# Patient Record
Sex: Female | Born: 1970 | Race: White | Hispanic: No | Marital: Married | Smoking: Never smoker
Health system: Southern US, Community
[De-identification: ages and names within clinical notes are randomized; demographics above are authoritative.]

---

## 2011-07-10 ENCOUNTER — Ambulatory Visit: Payer: Self-pay

## 2012-08-26 ENCOUNTER — Ambulatory Visit: Payer: Self-pay

## 2013-08-28 ENCOUNTER — Ambulatory Visit: Payer: Self-pay | Admitting: Obstetrics and Gynecology

## 2013-09-02 ENCOUNTER — Ambulatory Visit: Payer: Self-pay | Admitting: Obstetrics and Gynecology

## 2014-09-01 ENCOUNTER — Ambulatory Visit: Payer: Self-pay | Admitting: Obstetrics and Gynecology

## 2014-09-13 ENCOUNTER — Ambulatory Visit: Payer: Self-pay | Admitting: Obstetrics and Gynecology

## 2015-09-19 ENCOUNTER — Other Ambulatory Visit: Payer: Self-pay | Admitting: Obstetrics and Gynecology

## 2015-09-19 DIAGNOSIS — Z1231 Encounter for screening mammogram for malignant neoplasm of breast: Secondary | ICD-10-CM

## 2015-09-21 ENCOUNTER — Ambulatory Visit
Admission: RE | Admit: 2015-09-21 | Discharge: 2015-09-21 | Disposition: A | Discharge status: Home / Self Care | Payer: Managed Care, Other (non HMO) | Source: Ambulatory Visit | Attending: Obstetrics and Gynecology | Admitting: Obstetrics and Gynecology

## 2015-09-21 ENCOUNTER — Other Ambulatory Visit: Payer: Self-pay | Admitting: Obstetrics and Gynecology

## 2015-09-21 DIAGNOSIS — Z1231 Encounter for screening mammogram for malignant neoplasm of breast: Secondary | ICD-10-CM | POA: Insufficient documentation

## 2015-09-26 ENCOUNTER — Other Ambulatory Visit: Payer: Self-pay | Admitting: Obstetrics and Gynecology

## 2015-09-26 ENCOUNTER — Ambulatory Visit
Admission: RE | Admit: 2015-09-26 | Discharge: 2015-09-26 | Disposition: A | Discharge status: Home / Self Care | Payer: Managed Care, Other (non HMO) | Source: Ambulatory Visit | Attending: Obstetrics and Gynecology | Admitting: Obstetrics and Gynecology

## 2015-09-26 DIAGNOSIS — R928 Other abnormal and inconclusive findings on diagnostic imaging of breast: Secondary | ICD-10-CM

## 2015-09-26 DIAGNOSIS — N6001 Solitary cyst of right breast: Secondary | ICD-10-CM | POA: Insufficient documentation

## 2015-10-06 ENCOUNTER — Other Ambulatory Visit: Payer: Managed Care, Other (non HMO)

## 2015-10-06 ENCOUNTER — Ambulatory Visit: Payer: Managed Care, Other (non HMO)

## 2017-01-23 IMAGING — MG MM DIAG BREAST TOMO UNI RIGHT
6 series · 6 of 14 positions shown · non-contrast
Comparison: Previous exam(s).

CLINICAL DATA: Call back from screening for a possible mass and
possible asymmetry in the right breast.

EXAM:
DIGITAL DIAGNOSTIC RIGHT MAMMOGRAM WITH 3D TOMOSYNTHESIS WITH CAD
ULTRASOUND RIGHT BREAST

[R CC]
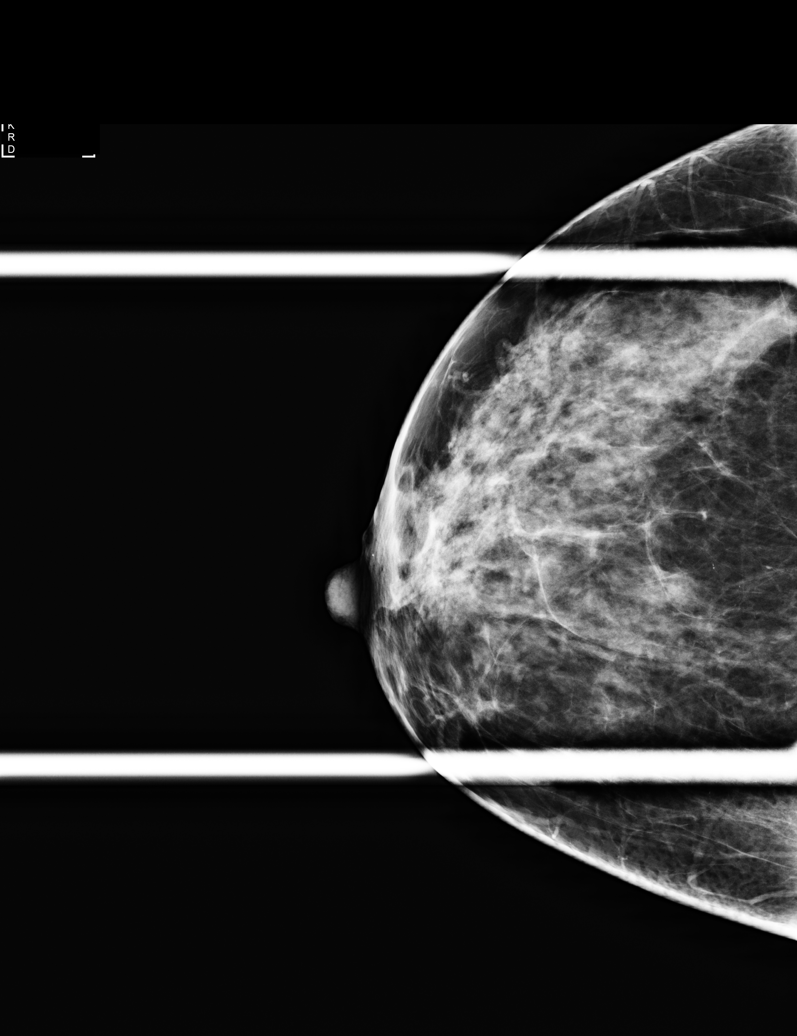

[R MLO synth-2D]
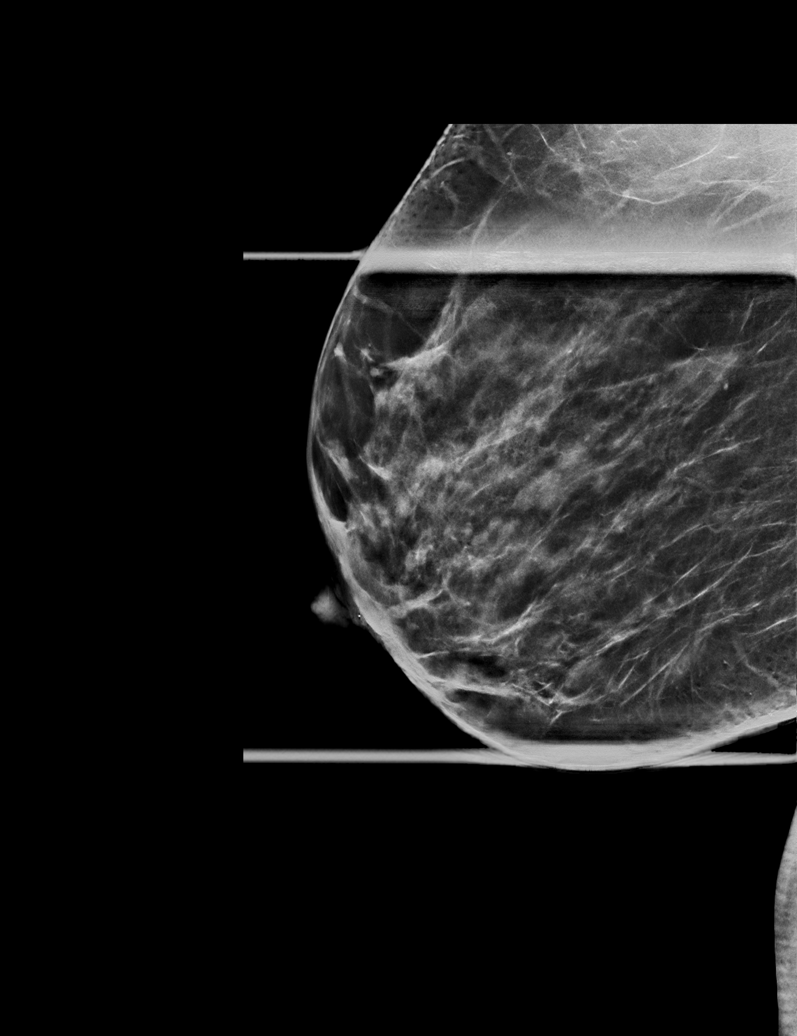

[R MLO]
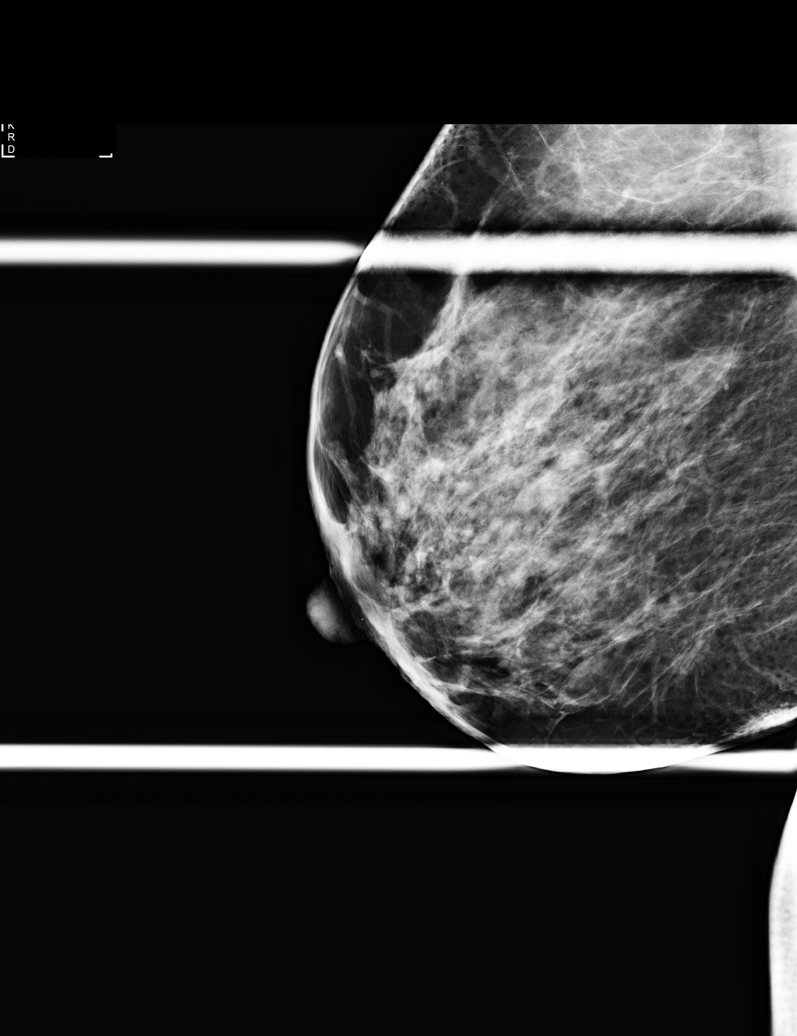

[R CC synth-2D]
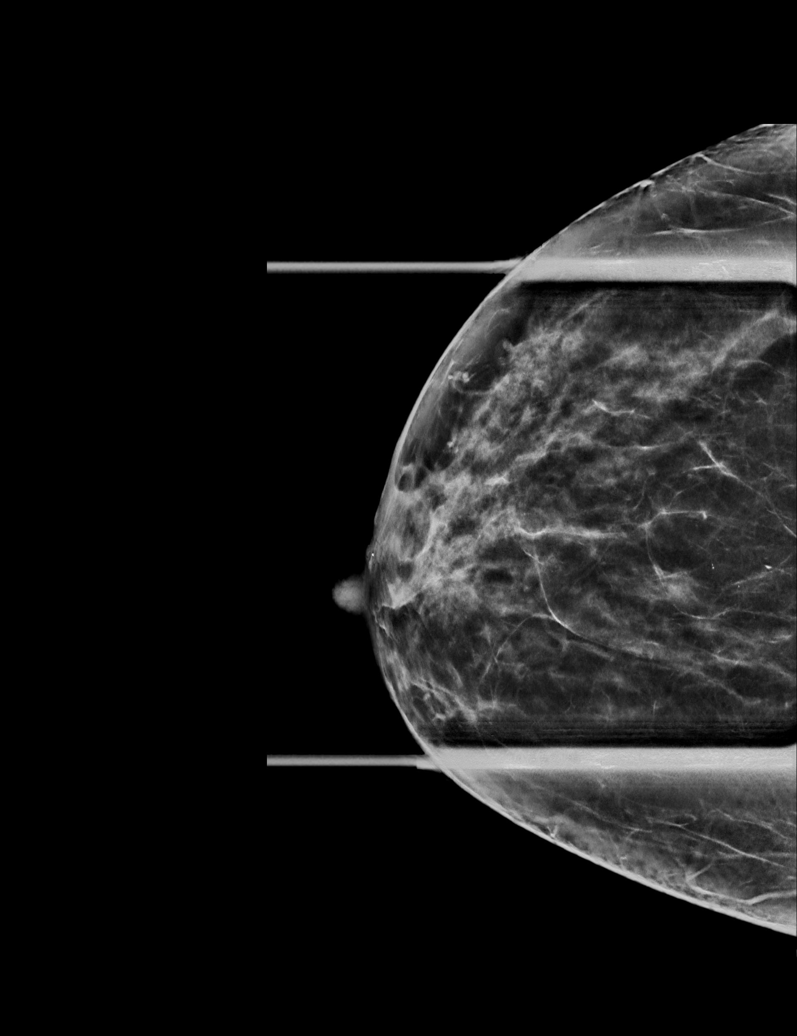

[R CC tomo · tomo slice 32/63.0]
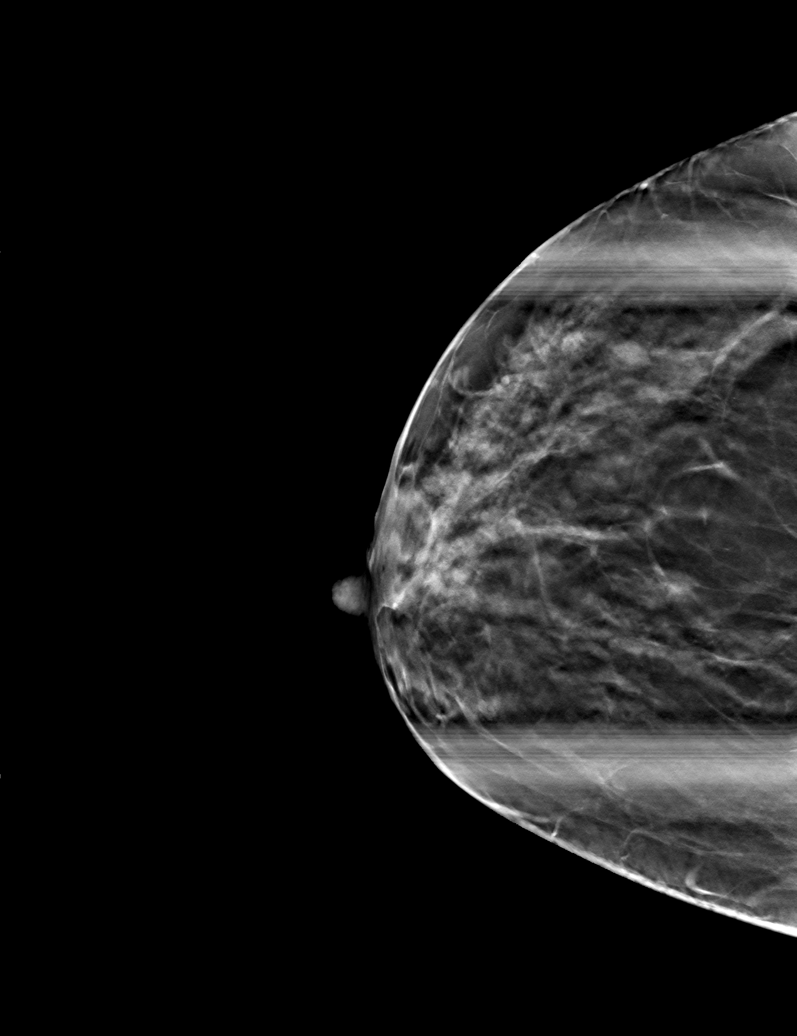

[R MLO tomo · tomo slice 35/68.0]
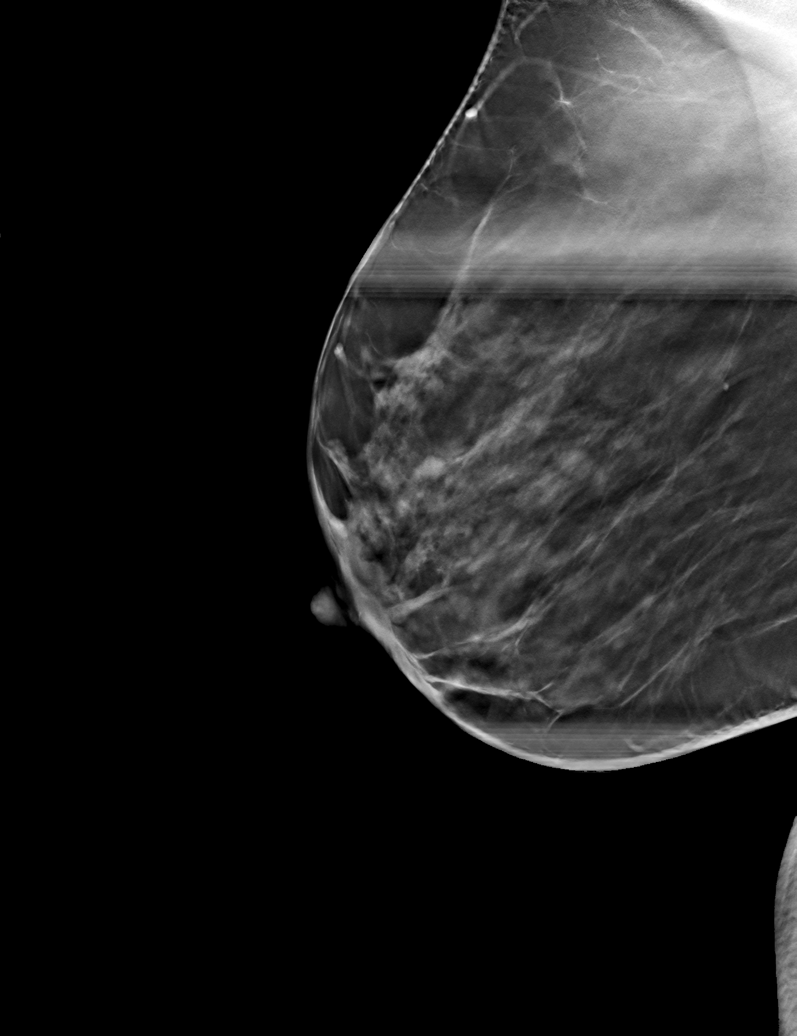

[6 of 14 positions shown; findings below may reference images not displayed]

ACR Breast Density Category c: The breast tissue is heterogeneously
dense, which may obscure small masses.
FINDINGS: On the spot-compression 2D and 3D images, several small masses are
noted extending from the lateral to the superior aspect of the right
breast. One of these represents the possible mass seen on the
current screening study. There are no areas of architectural
distortion and no suspicious calcifications.

Mammographic images were processed with CAD.

On physical exam, no mass is palpated in the lateral or upper outer
right breast.

Targeted ultrasound is performed, showing multiple small cysts
throughout the lateral and upper outer right breast. A group of
cysts is seen in the 11 o'clock position 3 cm from nipple. All cysts
measure 4 mm. There are no solid masses or suspicious lesions.
IMPRESSION: Multiple benign right breast cysts.  No evidence of malignancy.

RECOMMENDATION:
Screening mammogram in one year.(Code:EG-D-SI2)

I have discussed the findings and recommendations with the patient.
Results were also provided in writing at the conclusion of the
visit. If applicable, a reminder letter will be sent to the patient
regarding the next appointment.

BI-RADS CATEGORY  2: Benign.

## 2017-04-09 ENCOUNTER — Other Ambulatory Visit: Payer: Self-pay | Admitting: Obstetrics and Gynecology

## 2017-04-09 DIAGNOSIS — Z1231 Encounter for screening mammogram for malignant neoplasm of breast: Secondary | ICD-10-CM

## 2017-04-17 ENCOUNTER — Ambulatory Visit
Admission: RE | Admit: 2017-04-17 | Discharge: 2017-04-17 | Disposition: A | Discharge status: Home / Self Care | Payer: BLUE CROSS/BLUE SHIELD | Source: Ambulatory Visit | Attending: Obstetrics and Gynecology | Admitting: Obstetrics and Gynecology

## 2017-04-17 DIAGNOSIS — Z1231 Encounter for screening mammogram for malignant neoplasm of breast: Secondary | ICD-10-CM | POA: Diagnosis not present

## 2017-11-04 IMAGING — US US BREAST LTD UNI RIGHT INC AXILLA
1 series · 13 of 20 positions shown · non-contrast
Comparison: Previous exam(s).

CLINICAL DATA: Call back from screening for a possible mass and
possible asymmetry in the right breast.

EXAM:
DIGITAL DIAGNOSTIC RIGHT MAMMOGRAM WITH 3D TOMOSYNTHESIS WITH CAD
ULTRASOUND RIGHT BREAST

[Series 1: us breast ltd uni right inc axilla · 0.06mm/px · 13 of 20 slices shown]
[im 1/20]
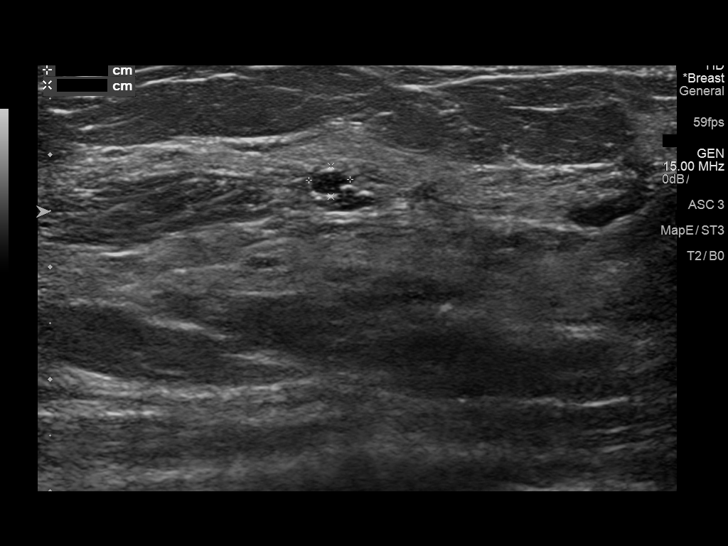
[im 3/20]
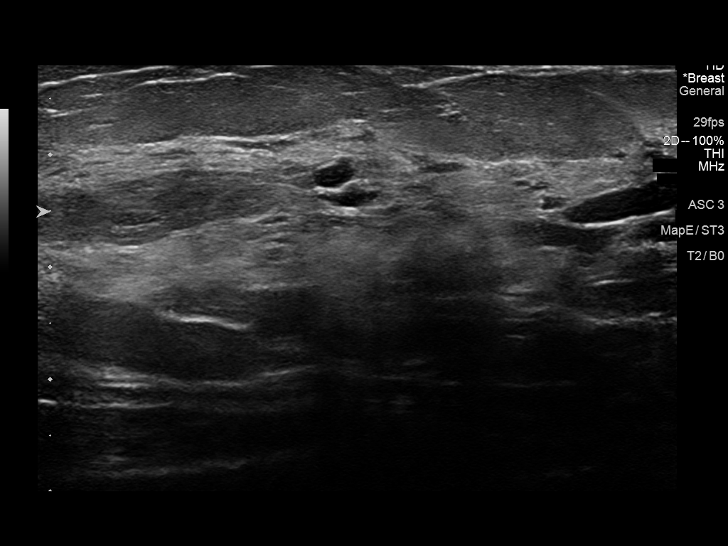
[im 4/20]
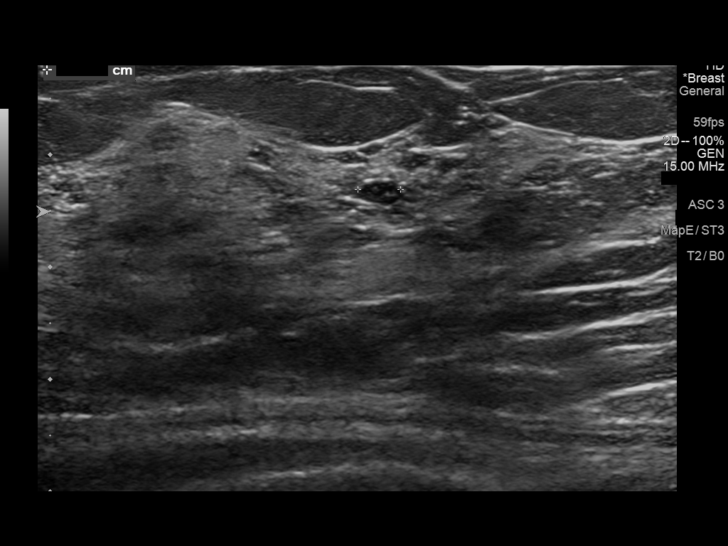
[im 6/20]
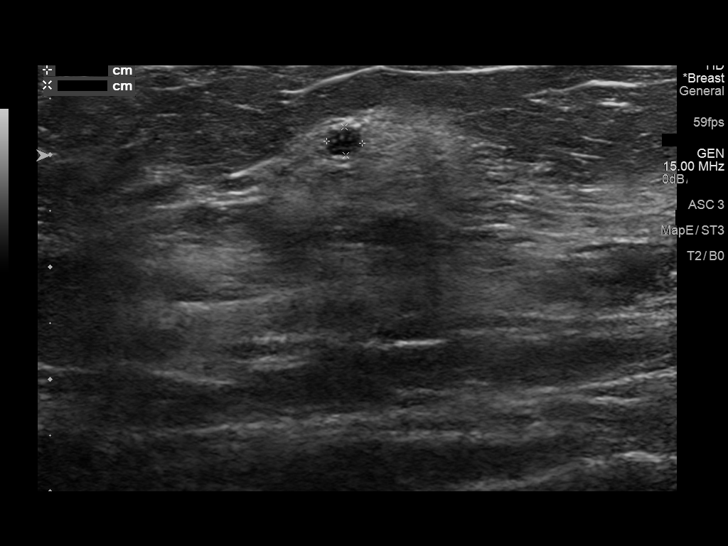
[im 7/20]
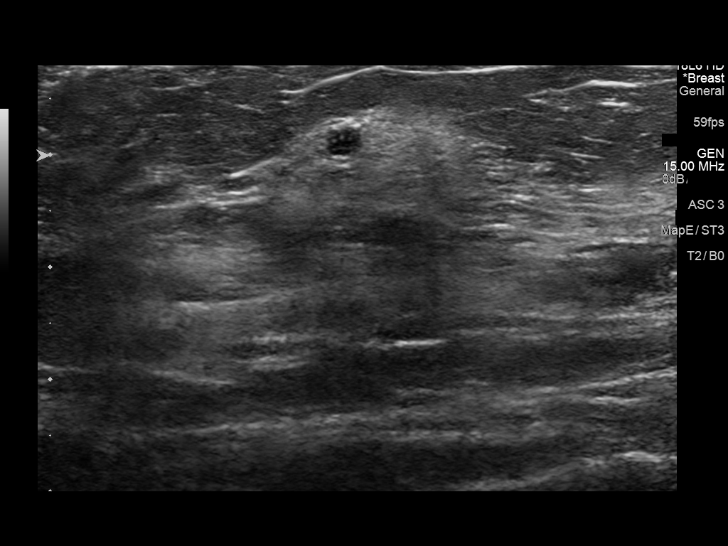
[im 9/20]
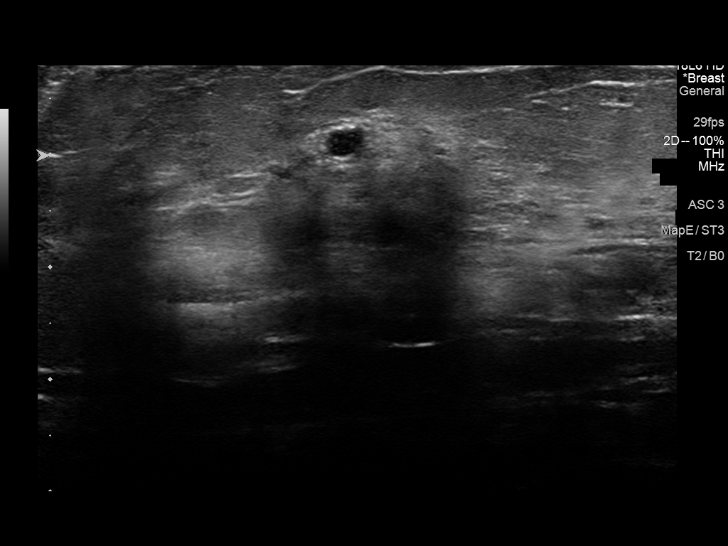
[im 11/20]
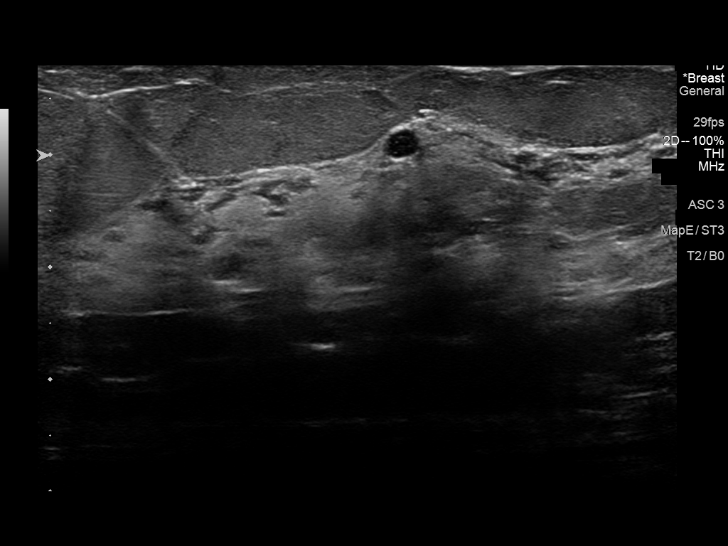
[im 12/20]
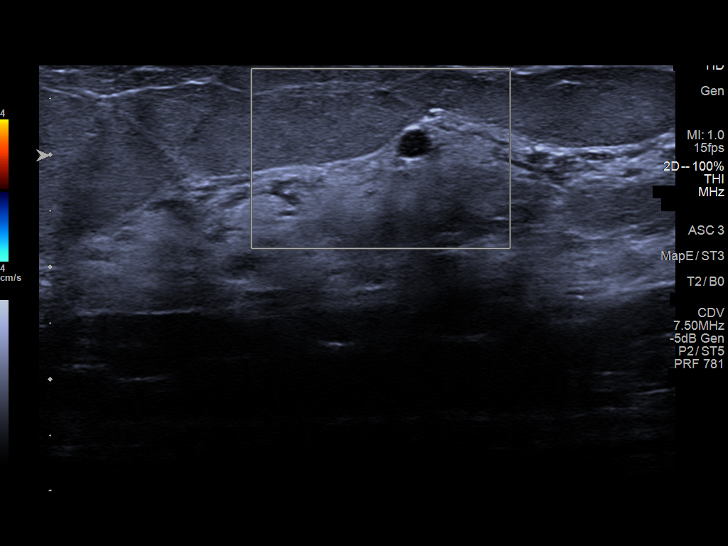
[im 14/20]
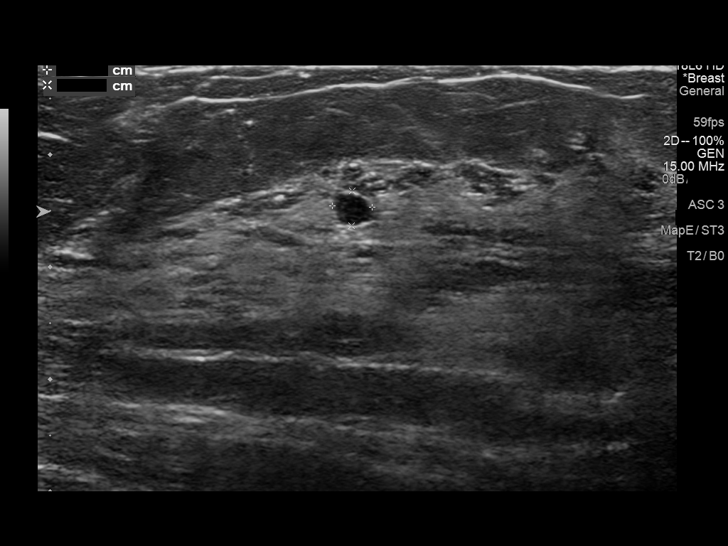
[im 15/20]
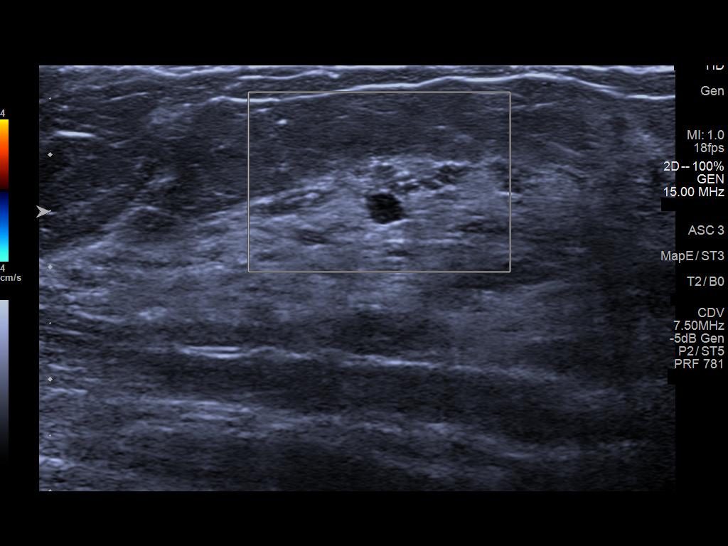
[im 17/20]
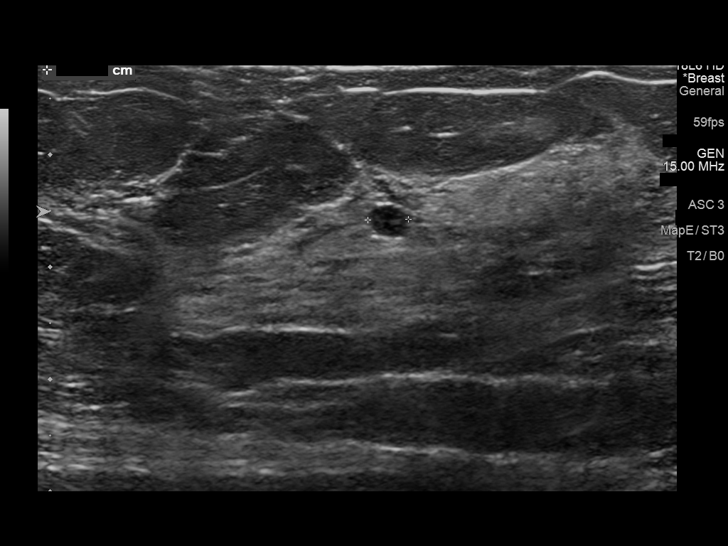
[im 18/20]
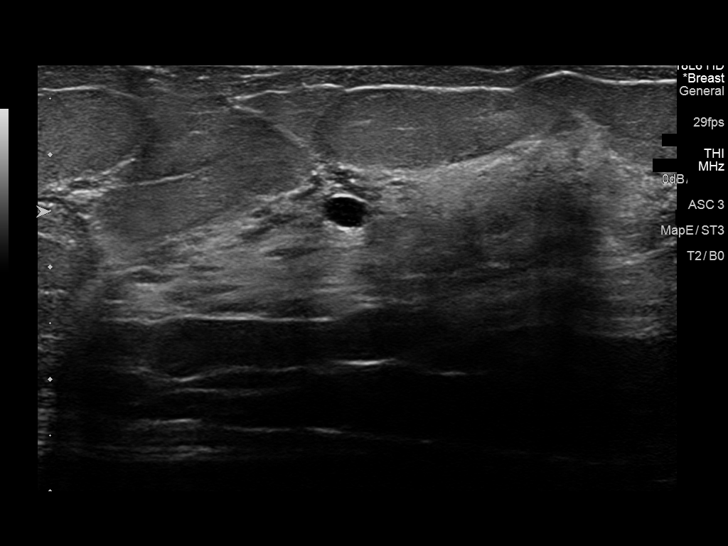
[im 20/20]
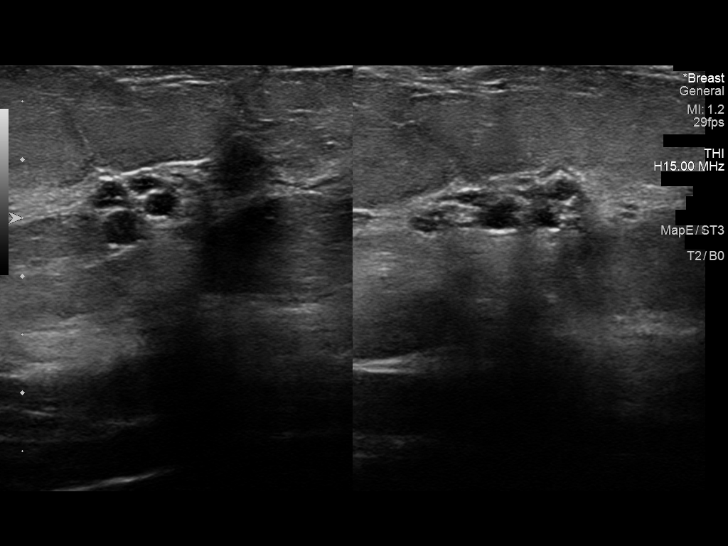

[13 of 20 positions shown; findings below may reference images not displayed]

ACR Breast Density Category c: The breast tissue is heterogeneously
dense, which may obscure small masses.
FINDINGS: On the spot-compression 2D and 3D images, several small masses are
noted extending from the lateral to the superior aspect of the right
breast. One of these represents the possible mass seen on the
current screening study. There are no areas of architectural
distortion and no suspicious calcifications.

Mammographic images were processed with CAD.

On physical exam, no mass is palpated in the lateral or upper outer
right breast.

Targeted ultrasound is performed, showing multiple small cysts
throughout the lateral and upper outer right breast. A group of
cysts is seen in the 11 o'clock position 3 cm from nipple. All cysts
measure 4 mm. There are no solid masses or suspicious lesions.
IMPRESSION: Multiple benign right breast cysts.  No evidence of malignancy.

RECOMMENDATION:
Screening mammogram in one year.(Code:EG-D-SI2)

I have discussed the findings and recommendations with the patient.
Results were also provided in writing at the conclusion of the
visit. If applicable, a reminder letter will be sent to the patient
regarding the next appointment.

BI-RADS CATEGORY  2: Benign.

## 2019-02-04 ENCOUNTER — Other Ambulatory Visit: Payer: Self-pay | Admitting: Obstetrics and Gynecology

## 2019-02-04 DIAGNOSIS — Z1231 Encounter for screening mammogram for malignant neoplasm of breast: Secondary | ICD-10-CM

## 2019-02-17 ENCOUNTER — Ambulatory Visit
Admission: RE | Admit: 2019-02-17 | Discharge: 2019-02-17 | Disposition: A | Discharge status: Home / Self Care | Payer: BLUE CROSS/BLUE SHIELD | Source: Ambulatory Visit | Attending: Obstetrics and Gynecology | Admitting: Obstetrics and Gynecology

## 2019-02-17 ENCOUNTER — Encounter (INDEPENDENT_AMBULATORY_CARE_PROVIDER_SITE_OTHER): Payer: Self-pay

## 2019-02-17 ENCOUNTER — Other Ambulatory Visit: Payer: Self-pay

## 2019-02-17 DIAGNOSIS — Z1231 Encounter for screening mammogram for malignant neoplasm of breast: Secondary | ICD-10-CM

## 2019-02-18 ENCOUNTER — Other Ambulatory Visit: Payer: Self-pay | Admitting: Obstetrics and Gynecology

## 2019-02-18 DIAGNOSIS — N6310 Unspecified lump in the right breast, unspecified quadrant: Secondary | ICD-10-CM

## 2019-02-18 DIAGNOSIS — R928 Other abnormal and inconclusive findings on diagnostic imaging of breast: Secondary | ICD-10-CM

## 2019-02-27 ENCOUNTER — Ambulatory Visit
Admission: RE | Admit: 2019-02-27 | Discharge: 2019-02-27 | Disposition: A | Discharge status: Home / Self Care | Payer: BLUE CROSS/BLUE SHIELD | Source: Ambulatory Visit | Attending: Obstetrics and Gynecology | Admitting: Obstetrics and Gynecology

## 2019-02-27 ENCOUNTER — Other Ambulatory Visit: Payer: Self-pay

## 2019-02-27 DIAGNOSIS — N6310 Unspecified lump in the right breast, unspecified quadrant: Secondary | ICD-10-CM | POA: Insufficient documentation

## 2019-02-27 DIAGNOSIS — R928 Other abnormal and inconclusive findings on diagnostic imaging of breast: Secondary | ICD-10-CM | POA: Diagnosis present

## 2019-12-15 ENCOUNTER — Other Ambulatory Visit: Payer: Self-pay | Admitting: Obstetrics & Gynecology

## 2019-12-15 DIAGNOSIS — Z1231 Encounter for screening mammogram for malignant neoplasm of breast: Secondary | ICD-10-CM

## 2020-02-22 ENCOUNTER — Other Ambulatory Visit: Payer: Self-pay

## 2020-02-22 ENCOUNTER — Ambulatory Visit
Admission: RE | Admit: 2020-02-22 | Discharge: 2020-02-22 | Disposition: A | Discharge status: Home / Self Care | Payer: BLUE CROSS/BLUE SHIELD | Source: Ambulatory Visit | Attending: Obstetrics & Gynecology | Admitting: Obstetrics & Gynecology

## 2020-02-22 DIAGNOSIS — Z1231 Encounter for screening mammogram for malignant neoplasm of breast: Secondary | ICD-10-CM | POA: Diagnosis not present

## 2021-01-24 ENCOUNTER — Other Ambulatory Visit: Payer: Self-pay | Admitting: Obstetrics & Gynecology

## 2021-01-24 DIAGNOSIS — Z1231 Encounter for screening mammogram for malignant neoplasm of breast: Secondary | ICD-10-CM

## 2021-02-22 ENCOUNTER — Ambulatory Visit
Admission: RE | Admit: 2021-02-22 | Discharge: 2021-02-22 | Disposition: A | Discharge status: Home / Self Care | Payer: BLUE CROSS/BLUE SHIELD | Source: Ambulatory Visit | Attending: Obstetrics & Gynecology | Admitting: Obstetrics & Gynecology

## 2021-02-22 ENCOUNTER — Other Ambulatory Visit: Payer: Self-pay

## 2021-02-22 DIAGNOSIS — Z1231 Encounter for screening mammogram for malignant neoplasm of breast: Secondary | ICD-10-CM | POA: Insufficient documentation

## 2021-12-04 ENCOUNTER — Other Ambulatory Visit: Payer: Self-pay | Admitting: Obstetrics & Gynecology

## 2021-12-04 DIAGNOSIS — Z1231 Encounter for screening mammogram for malignant neoplasm of breast: Secondary | ICD-10-CM

## 2022-02-26 ENCOUNTER — Ambulatory Visit: Payer: BLUE CROSS/BLUE SHIELD

## 2022-03-06 ENCOUNTER — Ambulatory Visit
Admission: RE | Admit: 2022-03-06 | Discharge: 2022-03-06 | Disposition: A | Discharge status: Home / Self Care | Payer: BC Managed Care – PPO | Source: Ambulatory Visit | Attending: Obstetrics & Gynecology | Admitting: Obstetrics & Gynecology

## 2022-03-06 DIAGNOSIS — Z1231 Encounter for screening mammogram for malignant neoplasm of breast: Secondary | ICD-10-CM | POA: Insufficient documentation

## 2022-03-08 ENCOUNTER — Other Ambulatory Visit: Payer: Self-pay | Admitting: Obstetrics & Gynecology

## 2022-03-09 ENCOUNTER — Other Ambulatory Visit: Payer: Self-pay | Admitting: Obstetrics & Gynecology

## 2022-03-09 DIAGNOSIS — N6489 Other specified disorders of breast: Secondary | ICD-10-CM

## 2022-03-09 DIAGNOSIS — R928 Other abnormal and inconclusive findings on diagnostic imaging of breast: Secondary | ICD-10-CM

## 2022-03-24 ENCOUNTER — Encounter: Payer: Self-pay | Admitting: Emergency Medicine

## 2022-03-24 ENCOUNTER — Ambulatory Visit
Admission: EM | Admit: 2022-03-24 | Discharge: 2022-03-24 | Disposition: A | Discharge status: Home / Self Care | Payer: BC Managed Care – PPO | Attending: Emergency Medicine | Admitting: Emergency Medicine

## 2022-03-24 DIAGNOSIS — N390 Urinary tract infection, site not specified: Secondary | ICD-10-CM | POA: Insufficient documentation

## 2022-03-24 LAB — URINALYSIS, MICROSCOPIC (REFLEX)

## 2022-03-24 LAB — URINALYSIS, ROUTINE W REFLEX MICROSCOPIC

## 2022-03-24 MED ORDER — NITROFURANTOIN MONOHYD MACRO 100 MG PO CAPS: 100.0000 mg | ORAL_CAPSULE | Freq: Two times a day (BID) | ORAL | 0 refills | Status: DC

## 2022-03-24 MED ORDER — PHENAZOPYRIDINE HCL 200 MG PO TABS: 200.0000 mg | ORAL_TABLET | Freq: Three times a day (TID) | ORAL | 0 refills | Status: AC

## 2022-03-24 NOTE — ED Triage Notes (Signed)
Patient c/o burning when urinating and urinary frequency that started yesterday.  Patient states that she took OTC AZO.

## 2022-03-24 NOTE — Discharge Instructions (Signed)

## 2022-03-24 NOTE — ED Provider Notes (Signed)
[facility]    CSN: [id number] Arrival date & time: 03/24/22  1118      History   Chief Complaint Chief Complaint  Patient presents with   Urinary Frequency   Dysuria    HPI Yannis Gumbs is a 51 y.o. female.   HPI  51 year old female here for evaluation of urinary complaints.  Patient reports that her symptoms began yesterday and they consist of burning with urination, urinary urgency, urinary frequency, and suprapubic pain.  She denies any fever, blood in her urine, cloudiness to her urine, low back pain, nausea, or vomiting.  She has been using over-the-counter Azo and also drinking cranberry juice without any improvement of symptoms.  History reviewed. No pertinent past medical history.  There are no problems to display for this patient.   History reviewed. No pertinent surgical history.  OB History   No obstetric history on file.      Home Medications    Prior to Admission medications   Medication Sig Start Date End Date Taking? Authorizing Provider  nitrofurantoin, macrocrystal-monohydrate, (MACROBID) 100 MG capsule Take 1 capsule (100 mg total) by mouth 2 (two) times daily. 03/24/22  Yes Becky Augusta, NP  phenazopyridine (PYRIDIUM) 200 MG tablet Take 1 tablet (200 mg total) by mouth 3 (three) times daily. 03/24/22  Yes Becky Augusta, NP  phentermine (ADIPEX-P) 37.5 MG tablet Take 37.5 mg by mouth every morning. 03/09/22  Yes [provider]    Family History Family History  Problem Relation Age of Onset   Breast cancer Maternal Grandmother        late 39's or early 60's   Ovarian cancer Maternal Aunt        26's    Social History Social History   Tobacco Use   Smoking status: Never   Smokeless tobacco: Never  Vaping Use   Vaping Use: Never used  Substance Use Topics   Alcohol use: Not Currently   Drug use: Never     Allergies   Patient has no known allergies.   Review of Systems Review of Systems   Constitutional:  Negative for fever.  Gastrointestinal:  Positive for abdominal pain. Negative for nausea and vomiting.  Genitourinary:  Positive for dysuria, frequency and urgency. Negative for hematuria.  Musculoskeletal:  Negative for back pain.  Hematological: Negative.   Psychiatric/Behavioral: Negative.       Physical Exam Triage Vital Signs ED Triage Vitals  Enc Vitals Group     BP 03/24/22 1139 119/84     Pulse Rate 03/24/22 1139 74     Resp 03/24/22 1139 14     Temp 03/24/22 1139 97.8 F (36.6 C)     Temp Source 03/24/22 1139 Oral     SpO2 03/24/22 1139 100 %     Weight 03/24/22 1135 185 lb (83.9 kg)     Height 03/24/22 1135 5\' 7"  (1.702 m)     Head Circumference --      Peak Flow --      Pain Score 03/24/22 1135 6     Pain Loc --      Pain Edu? --      Excl. in GC? --    No data found.  Updated Vital Signs BP 119/84 (BP Location: Right Arm)   Pulse 74   Temp 97.8 F (36.6 C) (Oral)   Resp 14   Ht 5\' 7"  (1.702 m)   Wt 185 lb (83.9 kg)   SpO2 100%   BMI  28.98 kg/m   Visual Acuity Right Eye Distance:   Left Eye Distance:   Bilateral Distance:    Right Eye Near:   Left Eye Near:    Bilateral Near:     Physical Exam Vitals and nursing note reviewed.  Constitutional:      Appearance: Normal appearance. She is not ill-appearing.  HENT:     Head: Normocephalic and atraumatic.  Cardiovascular:     Rate and Rhythm: Normal rate and regular rhythm.     Pulses: Normal pulses.     Heart sounds: Normal heart sounds. No murmur heard.    No friction rub. No gallop.  Pulmonary:     Effort: Pulmonary effort is normal.     Breath sounds: Normal breath sounds. No wheezing, rhonchi or rales.  Abdominal:     General: Abdomen is flat.     Palpations: Abdomen is soft.     Tenderness: There is no abdominal tenderness. There is no right CVA tenderness, left CVA tenderness, guarding or rebound.  Skin:    General: Skin is warm and dry.     Capillary Refill:  Capillary refill takes less than 2 seconds.     Findings: No erythema or rash.  Neurological:     General: No focal deficit present.     Mental Status: She is alert and oriented to person, place, and time.  Psychiatric:        Mood and Affect: Mood normal.        Behavior: Behavior normal.        Thought Content: Thought content normal.        Judgment: Judgment normal.      UC Treatments / Results  Labs (all labs ordered are listed, but only abnormal results are displayed) Labs Reviewed  URINALYSIS, ROUTINE W REFLEX MICROSCOPIC - Abnormal; Notable for the following components:      Result Value   Color, Urine ORANGE (*)    APPearance HAZY (*)    Glucose, UA   (*)    Value: TEST NOT REPORTED DUE TO COLOR INTERFERENCE OF URINE PIGMENT   Hgb urine dipstick   (*)    Value: TEST NOT REPORTED DUE TO COLOR INTERFERENCE OF URINE PIGMENT   Bilirubin Urine   (*)    Value: TEST NOT REPORTED DUE TO COLOR INTERFERENCE OF URINE PIGMENT   Ketones, ur   (*)    Value: TEST NOT REPORTED DUE TO COLOR INTERFERENCE OF URINE PIGMENT   Protein, ur   (*)    Value: TEST NOT REPORTED DUE TO COLOR INTERFERENCE OF URINE PIGMENT   Nitrite   (*)    Value: TEST NOT REPORTED DUE TO COLOR INTERFERENCE OF URINE PIGMENT   Leukocytes,Ua   (*)    Value: TEST NOT REPORTED DUE TO COLOR INTERFERENCE OF URINE PIGMENT   All other components within normal limits  URINALYSIS, MICROSCOPIC (REFLEX) - Abnormal; Notable for the following components:   Bacteria, UA FEW (*)    All other components within normal limits  URINE CULTURE    EKG   Radiology No results found.  Procedures Procedures (including critical care time)  Medications Ordered in UC Medications - No data to display  Initial Impression / Assessment and Plan / UC Course  I have reviewed the triage vital signs and the nursing notes.  Pertinent labs & imaging results that were available during my care of the patient were reviewed by me and  considered in my medical decision making (see  chart for details).   Patient is a very pleasant, nontoxic-appearing 51 year old female here for evaluation of urinary complaints outlined HPI above.  Her physical exam reveals a benign cardiopulmonary exam with S1-S2 heart sounds revealing regular rate and rhythm.  Lung sounds are clear to auscultation all fields.  No CVA tenderness on exam.  Abdomen is soft, flat, and nontender.  Urinalysis was collected at triage and is pending.  Urinalysis has colorimetric interference secondary to Azo.  Reflex microscopy shows 21-50 WBCs and few bacteria.  We will send urine for culture.  I will treat the patient for urinary tract infection with Macrobid and Pyridium.  I will also culture the urine and if need be change the antibiotic therapy based on the culture results.  I have encouraged the patient to increase oral fluid intake such increases her urine production and flushes her urinary tract.  Return precautions reviewed.   Final Clinical Impressions(s) / UC Diagnoses   Final diagnoses:  Lower urinary tract infectious disease     Discharge Instructions      Take the Macrobid twice daily for 5 days with food for treatment of urinary tract infection.  Use the Pyridium every 8 hours as needed for urinary discomfort.  This will turn your urine a bright red-orange.  Increase your oral fluid intake so that you increase your urine production and or flushing your urinary system.  Take an over-the-counter probiotic, such as Culturelle-Align-Activia, 1 hour after each dose of antibiotic to prevent diarrhea or yeast infections from forming.  We will culture urine and change the antibiotics if necessary.  Return for reevaluation, or see your primary care provider, for any new or worsening symptoms.      ED Prescriptions     Medication Sig Dispense Auth. Provider   nitrofurantoin, macrocrystal-monohydrate, (MACROBID) 100 MG capsule Take 1 capsule (100 mg  total) by mouth 2 (two) times daily. 10 capsule Becky Augusta, NP   phenazopyridine (PYRIDIUM) 200 MG tablet Take 1 tablet (200 mg total) by mouth 3 (three) times daily. 6 tablet Becky Augusta, NP      PDMP not reviewed this encounter.   Becky Augusta, NP 03/24/22 1221

## 2022-03-26 ENCOUNTER — Ambulatory Visit
Admission: RE | Admit: 2022-03-26 | Discharge: 2022-03-26 | Disposition: A | Discharge status: Home / Self Care | Payer: BC Managed Care – PPO | Source: Ambulatory Visit | Attending: Obstetrics & Gynecology | Admitting: Obstetrics & Gynecology

## 2022-03-26 DIAGNOSIS — R928 Other abnormal and inconclusive findings on diagnostic imaging of breast: Secondary | ICD-10-CM

## 2022-03-26 DIAGNOSIS — N6489 Other specified disorders of breast: Secondary | ICD-10-CM

## 2022-03-26 LAB — URINE CULTURE: Culture: 20000 — AB

## 2022-03-27 LAB — URINE CULTURE

## 2022-03-29 ENCOUNTER — Ambulatory Visit
Admission: RE | Admit: 2022-03-29 | Discharge: 2022-03-29 | Disposition: A | Discharge status: Home / Self Care | Payer: BC Managed Care – PPO | Source: Ambulatory Visit | Attending: Family Medicine | Admitting: Family Medicine

## 2022-03-29 VITALS — BP 118/78 | HR 97 | Temp 98.3°F | Ht 67.0 in | Wt 183.0 lb

## 2022-03-29 DIAGNOSIS — N3090 Cystitis, unspecified without hematuria: Secondary | ICD-10-CM | POA: Diagnosis not present

## 2022-03-29 LAB — URINALYSIS, MICROSCOPIC (REFLEX)

## 2022-03-29 LAB — URINALYSIS, ROUTINE W REFLEX MICROSCOPIC
Bilirubin Urine: NEGATIVE
Glucose, UA: 100 mg/dL — AB
Nitrite: POSITIVE — AB
Protein, ur: NEGATIVE mg/dL
Specific Gravity, Urine: <1.005 — ABNORMAL LOW (ref 1.005–1.030)
pH: 5 (ref 5.0–8.0)

## 2022-03-29 LAB — WET PREP, GENITAL
Clue Cells Wet Prep HPF POC: NONE SEEN
Sperm: NONE SEEN
Trich, Wet Prep: NONE SEEN
WBC, Wet Prep HPF POC: <10 — AB (ref <10)

## 2022-03-29 MED ORDER — FLUCONAZOLE 150 MG PO TABS: 150.0000 mg | ORAL_TABLET | Freq: Every day | ORAL | 0 refills | Status: AC

## 2022-03-29 MED ORDER — CEPHALEXIN 500 MG PO CAPS: 500.0000 mg | ORAL_CAPSULE | Freq: Four times a day (QID) | ORAL | 0 refills | Status: AC

## 2022-03-29 NOTE — Discharge Instructions (Addendum)
Stop by the pharmacy to pick up your prescriptions.  Follow up with your primary care provider as needed.  

## 2022-03-29 NOTE — ED Provider Notes (Signed)
[facility]    CSN: [id number] Arrival date & time: 03/29/22  1839      History   Chief Complaint Chief Complaint  Patient presents with   Urinary Frequency   Dysuria    HPI Amy Boone is a 51 y.o. female.    Patient reports dysuria, urinary urgency and frequency.  She has not seen any blood in urine.  She took Macrobid for UTI that she was recently seen in the urgent care on Saturday.  She does not have any vaginal discharge, itching or odor.  She is not concerned for STDs.  No personal history of kidney stones.  Denies abdominal pain, nausea or vomiting, fevers.  She is worried that her symptoms not improving.      HPI  History reviewed. No pertinent past medical history.  There are no problems to display for this patient.   History reviewed. No pertinent surgical history.  OB History   No obstetric history on file.      Home Medications    Prior to Admission medications   Medication Sig Start Date End Date Taking? Authorizing Provider  cephALEXin (KEFLEX) 500 MG capsule Take 1 capsule (500 mg total) by mouth 4 (four) times daily. 03/29/22  Yes Katelind Pytel, DO  fluconazole (DIFLUCAN) 150 MG tablet Take 1 tablet (150 mg total) by mouth daily for 2 doses. 04/02/22 04/04/22 Yes Jaydien Panepinto, DO  phenazopyridine (PYRIDIUM) 200 MG tablet Take 1 tablet (200 mg total) by mouth 3 (three) times daily. 03/24/22   Becky Augusta, NP  phentermine (ADIPEX-P) 37.5 MG tablet Take 37.5 mg by mouth every morning. 03/09/22   [provider]    Family History Family History  Problem Relation Age of Onset   Breast cancer Maternal Grandmother        late 46's or early 60's   Ovarian cancer Maternal Aunt        103's    Social History Social History   Tobacco Use   Smoking status: Never   Smokeless tobacco: Never  Vaping Use   Vaping Use: Never used  Substance Use Topics   Alcohol use: Not Currently   Drug use: Never     Allergies    Patient has no known allergies.   Review of Systems Review of Systems: :negative unless otherwise stated in HPI.      Physical Exam Triage Vital Signs ED Triage Vitals  Enc Vitals Group     BP 03/29/22 1908 118/78     Pulse Rate 03/29/22 1908 97     Resp --      Temp 03/29/22 1908 98.3 F (36.8 C)     Temp Source 03/29/22 1908 Oral     SpO2 03/29/22 1908 99 %     Weight 03/29/22 1906 183 lb (83 kg)     Height 03/29/22 1906 5\' 7"  (1.702 m)     Head Circumference --      Peak Flow --      Pain Score 03/29/22 1906 9     Pain Loc --      Pain Edu? --      Excl. in GC? --    No data found.  Updated Vital Signs BP 118/78 (BP Location: Left Arm)   Pulse 97   Temp 98.3 F (36.8 C) (Oral)   Ht 5\' 7"  (1.702 m)   Wt 83 kg   SpO2 99%   BMI 28.66 kg/m   Visual Acuity Right Eye Distance:  Left Eye Distance:   Bilateral Distance:    Right Eye Near:   Left Eye Near:    Bilateral Near:     Physical Exam GEN: well appearing female in no acute distress  CVS: well perfused  RESP: speaking in full sentences without pause, no respiratory distress  GU: deferred, patient performed self swab    UC Treatments / Results  Labs (all labs ordered are listed, but only abnormal results are displayed) Labs Reviewed  WET PREP, GENITAL - Abnormal; Notable for the following components:      Result Value   Yeast Wet Prep HPF POC PRESENT (*)    WBC, Wet Prep HPF POC <10 (*)    All other components within normal limits  URINALYSIS, ROUTINE W REFLEX MICROSCOPIC - Abnormal; Notable for the following components:   Color, Urine ORANGE (*)    APPearance HAZY (*)    Specific Gravity, Urine <1.005 (*)    Glucose, UA 100 (*)    Hgb urine dipstick TRACE (*)    Ketones, ur TRACE (*)    Nitrite POSITIVE (*)    Leukocytes,Ua TRACE (*)    All other components within normal limits  URINALYSIS, MICROSCOPIC (REFLEX) - Abnormal; Notable for the following components:   Bacteria, UA FEW (*)     All other components within normal limits  URINE CULTURE    EKG   Radiology No results found.  Procedures Procedures (including critical care time)  Medications Ordered in UC Medications - No data to display  Initial Impression / Assessment and Plan / UC Course  I have reviewed the triage vital signs and the nursing notes.  Pertinent labs & imaging results that were available during my care of the patient were reviewed by me and considered in my medical decision making (see chart for details).     Patient is a 51 year old female with recent treatment for UTI with Macrobid.  She has completed this therapy. UA consistent with recurrent pyuria.  Hematuria not supported on microscopy.  She does have some mild glucose urea.  No medications on her medication list would cause this.  Patient has 1 UTI in the past year. Will treat with Keflex 4 times daily for 5 days.  Urine culture obtained.  Follow-up sensitivities and change antibiotics if needed.  She has history of yeast infections with antibiotics.  She has yeast on wet prep.  Will treat prophylactically with Diflucan.  Patient agrees with plan.  Discussed MDM, treatment plan and plan for follow-up with patient/parent who agrees with plan.      Final Clinical Impressions(s) / UC Diagnoses   Final diagnoses:  Cystitis     Discharge Instructions      Stop by the pharmacy to pick up your prescriptions.  Follow up with your primary care provider as needed.      ED Prescriptions     Medication Sig Dispense Auth. Provider   cephALEXin (KEFLEX) 500 MG capsule Take 1 capsule (500 mg total) by mouth 4 (four) times daily. 20 capsule Mirayah Wren, DO   fluconazole (DIFLUCAN) 150 MG tablet Take 1 tablet (150 mg total) by mouth daily for 2 doses. 2 tablet Katha Cabal, DO      PDMP not reviewed this encounter.   Katha Cabal, DO 03/29/22 2033

## 2022-03-29 NOTE — ED Triage Notes (Signed)
Patient reports burning with urination and urinary frequency.  Patient was recently seen and placed on Macrobid and she has not found any relief.

## 2022-03-31 LAB — URINE CULTURE: Culture: 30000 — AB

## 2023-02-19 ENCOUNTER — Other Ambulatory Visit: Payer: Self-pay | Admitting: Obstetrics & Gynecology

## 2023-02-19 DIAGNOSIS — Z1231 Encounter for screening mammogram for malignant neoplasm of breast: Secondary | ICD-10-CM

## 2023-03-13 ENCOUNTER — Ambulatory Visit
Admission: RE | Admit: 2023-03-13 | Discharge: 2023-03-13 | Disposition: A | Discharge status: Home / Self Care | Payer: BC Managed Care – PPO | Source: Ambulatory Visit | Attending: Obstetrics & Gynecology | Admitting: Obstetrics & Gynecology

## 2023-03-13 DIAGNOSIS — Z1231 Encounter for screening mammogram for malignant neoplasm of breast: Secondary | ICD-10-CM | POA: Diagnosis present

## 2023-06-18 ENCOUNTER — Other Ambulatory Visit: Payer: Self-pay | Admitting: Medical Genetics

## 2023-06-18 DIAGNOSIS — Z006 Encounter for examination for normal comparison and control in clinical research program: Secondary | ICD-10-CM

## 2023-06-24 ENCOUNTER — Other Ambulatory Visit
Admission: RE | Admit: 2023-06-24 | Discharge: 2023-06-24 | Disposition: A | Discharge status: Home / Self Care | Payer: Self-pay | Source: Ambulatory Visit | Attending: Medical Genetics | Admitting: Medical Genetics

## 2023-06-24 DIAGNOSIS — Z006 Encounter for examination for normal comparison and control in clinical research program: Secondary | ICD-10-CM | POA: Insufficient documentation

## 2023-07-02 LAB — HELIX MOLECULAR SCREEN: Genetic Analysis Overall Interpretation: NEGATIVE

## 2023-07-02 LAB — GENECONNECT MOLECULAR SCREEN

## 2024-03-17 ENCOUNTER — Other Ambulatory Visit: Payer: Self-pay | Admitting: Obstetrics & Gynecology

## 2024-03-17 DIAGNOSIS — Z1231 Encounter for screening mammogram for malignant neoplasm of breast: Secondary | ICD-10-CM

## 2024-03-18 ENCOUNTER — Ambulatory Visit
Admission: RE | Admit: 2024-03-18 | Discharge: 2024-03-18 | Disposition: A | Discharge status: Home / Self Care | Source: Ambulatory Visit | Attending: Obstetrics & Gynecology | Admitting: Obstetrics & Gynecology

## 2024-03-18 DIAGNOSIS — Z1231 Encounter for screening mammogram for malignant neoplasm of breast: Secondary | ICD-10-CM | POA: Diagnosis present
